# Patient Record
Sex: Male | Born: 1960
Health system: Southern US, Community
[De-identification: ages and names within clinical notes are randomized; demographics above are authoritative.]

## PROBLEM LIST (undated history)

## (undated) DIAGNOSIS — G5600 Carpal tunnel syndrome, unspecified upper limb: Secondary | ICD-10-CM

## (undated) DIAGNOSIS — H9190 Unspecified hearing loss, unspecified ear: Secondary | ICD-10-CM

## (undated) DIAGNOSIS — R0683 Snoring: Secondary | ICD-10-CM

## (undated) DIAGNOSIS — Z460 Encounter for fitting and adjustment of spectacles and contact lenses: Secondary | ICD-10-CM

## (undated) DIAGNOSIS — I10 Essential (primary) hypertension: Secondary | ICD-10-CM

## (undated) HISTORY — PX: WISDOM TOOTH EXTRACTION: SHX21

---

## 1996-10-06 HISTORY — PX: FINGER ARTHROPLASTY: SHX5017

## 1998-10-06 HISTORY — PX: LUMBAR LAMINECTOMY: SHX95

## 1998-10-06 HISTORY — PX: APPENDECTOMY: SHX54

## 2000-10-16 ENCOUNTER — Inpatient Hospital Stay (HOSPITAL_COMMUNITY): Admission: EM | Admit: 2000-10-16 | Discharge: 2000-10-17 | Payer: Self-pay

## 2001-02-17 ENCOUNTER — Encounter: Admission: RE | Admit: 2001-02-17 | Discharge: 2001-02-17 | Payer: Self-pay | Admitting: Neurosurgery

## 2001-02-17 ENCOUNTER — Encounter: Payer: Self-pay | Admitting: Neurosurgery

## 2001-02-22 ENCOUNTER — Encounter: Payer: Self-pay | Admitting: Neurosurgery

## 2001-02-22 ENCOUNTER — Ambulatory Visit (HOSPITAL_COMMUNITY): Admission: RE | Admit: 2001-02-22 | Discharge: 2001-02-23 | Payer: Self-pay | Admitting: Neurosurgery

## 2005-02-11 ENCOUNTER — Ambulatory Visit: Payer: Self-pay | Admitting: Internal Medicine

## 2006-04-22 ENCOUNTER — Ambulatory Visit: Payer: Self-pay | Admitting: Internal Medicine

## 2007-06-14 DIAGNOSIS — F411 Generalized anxiety disorder: Secondary | ICD-10-CM | POA: Insufficient documentation

## 2007-06-14 DIAGNOSIS — K219 Gastro-esophageal reflux disease without esophagitis: Secondary | ICD-10-CM | POA: Insufficient documentation

## 2011-03-13 ENCOUNTER — Ambulatory Visit
Admission: RE | Admit: 2011-03-13 | Discharge: 2011-03-13 | Disposition: A | Discharge status: Home / Self Care | Payer: BC Managed Care – PPO | Source: Ambulatory Visit | Attending: Family Medicine | Admitting: Family Medicine

## 2011-03-13 ENCOUNTER — Other Ambulatory Visit: Payer: Self-pay | Admitting: Family Medicine

## 2011-03-13 DIAGNOSIS — R0989 Other specified symptoms and signs involving the circulatory and respiratory systems: Secondary | ICD-10-CM

## 2011-03-13 DIAGNOSIS — R05 Cough: Secondary | ICD-10-CM

## 2011-03-13 DIAGNOSIS — R509 Fever, unspecified: Secondary | ICD-10-CM

## 2013-12-20 ENCOUNTER — Other Ambulatory Visit: Payer: Self-pay | Admitting: Orthopedic Surgery

## 2013-12-27 ENCOUNTER — Encounter (HOSPITAL_BASED_OUTPATIENT_CLINIC_OR_DEPARTMENT_OTHER): Payer: Self-pay | Admitting: *Deleted

## 2013-12-27 NOTE — Progress Notes (Signed)
No labs needed

## 2013-12-28 NOTE — H&P (Signed)
  [name] is an 53 y.o. male.   Chief Complaint: c/o chronic and progressive numbness and tingling of the left hand HPI: [name] returns for follow up evaluation of his bilateral carpal tunnel syndrome. He has had carpal tunnel symptoms for more than 10 years. He is accompanied by his wife during our consult. In 2011 we noted bilateral moderate carpal tunnel syndrome. He has lived with his splints and has postponed surgery until this time. He can no longer sleep comfortably at night. He has marked numbness any time he uses any vibrating tools at work. 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   Past Medical History  Diagnosis Date  . Contact lens/glasses fitting     wears contacts or glasses  . HOH (hard of hearing)     left ear  . Snores   . Carpal tunnel syndrome     Past Surgical History  Procedure Laterality Date  . Wisdom tooth extraction    . Lumbar laminectomy  2000  . Appendectomy  2000    lap append  . Finger arthroplasty  1998    cut tip lt index finger    History reviewed. No pertinent family history. Social History:  reports that he has never smoked. His smokeless tobacco use includes Chew. He reports that he does not drink alcohol or use illicit drugs.  Allergies: No Known Allergies  No prescriptions prior to admission    No results found for this or any previous visit (from the past 48 hour(s)).  No results found.   Pertinent items are noted in HPI.  Height 6' (1.829 m), weight 102.059 kg (225 lb).  General appearance: alert Head: Normocephalic, without obvious abnormality Neck: supple, symmetrical, trachea midline Resp: clear to auscultation bilaterally Cardio: regular rate and rhythm GI: normal findings: bowel sounds normal Extremities: . Inspection of his hands reveals diminished sweat patterns in the median distribution bilaterally. He has intact sensibility in the ulnar distribution and diminished sensibility in  the median distribution bilaterally. His radial sensibility is normal. He has full ROM of his wrist, fingers and thumbs. There is no sign of stenosing tenosynovitis. He has a positive wrist flexion test on the left at 10 seconds and right at 20 seconds.  Screening films of the hands demonstrate an osteophyte of his right distal scaphoid with a cyst in the distal pole of the scaphoid and minimal joint space narrowing at several IP joints.  X-rays of the left hand and wrist demonstrate a similar osteophyte at the distal pole of the left scaphoid without a cyst in the distal scaphoid. Pulses: 2+ and symmetric Skin: normal Neurologic: Grossly normal    Assessment/Plan Impression:  Left CTS  Plan: To the OR for left CTR.The procedure, risks,benefits and post-op course were discussed with the patient at length and they were in agreement with the plan.  DASNOIT,Hasnain Manheim J 12/28/2013, 4:25 PM  H&P documentation: 12/29/2013  -History and Physical Reviewed  -Patient has been re-examined  -No change in the plan of care  Wyn Forsterobert V Magdalynn Davilla Jr, MD

## 2013-12-29 ENCOUNTER — Encounter (HOSPITAL_BASED_OUTPATIENT_CLINIC_OR_DEPARTMENT_OTHER)
Admission: RE | Disposition: A | Discharge status: Home / Self Care | Payer: Self-pay | Source: Ambulatory Visit | Attending: Orthopedic Surgery

## 2013-12-29 ENCOUNTER — Ambulatory Visit (HOSPITAL_BASED_OUTPATIENT_CLINIC_OR_DEPARTMENT_OTHER): Payer: BC Managed Care – PPO | Admitting: Anesthesiology

## 2013-12-29 ENCOUNTER — Encounter (HOSPITAL_BASED_OUTPATIENT_CLINIC_OR_DEPARTMENT_OTHER): Payer: BC Managed Care – PPO | Admitting: Anesthesiology

## 2013-12-29 ENCOUNTER — Ambulatory Visit (HOSPITAL_BASED_OUTPATIENT_CLINIC_OR_DEPARTMENT_OTHER)
Admission: RE | Admit: 2013-12-29 | Discharge: 2013-12-29 | Disposition: A | Discharge status: Home / Self Care | Payer: BC Managed Care – PPO | Source: Ambulatory Visit | Attending: Orthopedic Surgery | Admitting: Orthopedic Surgery

## 2013-12-29 ENCOUNTER — Encounter (HOSPITAL_BASED_OUTPATIENT_CLINIC_OR_DEPARTMENT_OTHER): Payer: Self-pay

## 2013-12-29 DIAGNOSIS — G5600 Carpal tunnel syndrome, unspecified upper limb: Secondary | ICD-10-CM | POA: Insufficient documentation

## 2013-12-29 DIAGNOSIS — H9190 Unspecified hearing loss, unspecified ear: Secondary | ICD-10-CM | POA: Insufficient documentation

## 2013-12-29 DIAGNOSIS — K219 Gastro-esophageal reflux disease without esophagitis: Secondary | ICD-10-CM | POA: Insufficient documentation

## 2013-12-29 HISTORY — DX: Carpal tunnel syndrome, unspecified upper limb: G56.00

## 2013-12-29 HISTORY — DX: Snoring: R06.83

## 2013-12-29 HISTORY — PX: CARPAL TUNNEL RELEASE: SHX101

## 2013-12-29 HISTORY — DX: Unspecified hearing loss, unspecified ear: H91.90

## 2013-12-29 HISTORY — DX: Encounter for fitting and adjustment of spectacles and contact lenses: Z46.0

## 2013-12-29 LAB — POCT HEMOGLOBIN-HEMACUE: Hemoglobin: 16.8 g/dL (ref 13.0–17.0)

## 2013-12-29 SURGERY — CARPAL TUNNEL RELEASE
Anesthesia: General | Site: Wrist | Laterality: Left

## 2013-12-29 MED ORDER — LACTATED RINGERS IV SOLN
INTRAVENOUS | Status: DC
  Administered 2013-12-29: 08:00:00 via INTRAVENOUS

## 2013-12-29 MED ORDER — FENTANYL CITRATE 0.05 MG/ML IJ SOLN
INTRAMUSCULAR | Status: DC | PRN
  Administered 2013-12-29: 100 ug via INTRAVENOUS

## 2013-12-29 MED ORDER — FENTANYL CITRATE 0.05 MG/ML IJ SOLN
INTRAMUSCULAR | Status: AC
  Filled 2013-12-29: qty 4

## 2013-12-29 MED ORDER — MIDAZOLAM HCL 2 MG/2ML IJ SOLN
INTRAMUSCULAR | Status: AC
  Filled 2013-12-29: qty 2

## 2013-12-29 MED ORDER — METOCLOPRAMIDE HCL 5 MG/ML IJ SOLN: 10.0000 mg | Freq: Once | INTRAMUSCULAR | Status: DC | PRN

## 2013-12-29 MED ORDER — LIDOCAINE HCL 2 % IJ SOLN
INTRAMUSCULAR | Status: DC | PRN
  Administered 2013-12-29: 2 mL

## 2013-12-29 MED ORDER — ONDANSETRON HCL 4 MG/2ML IJ SOLN
INTRAMUSCULAR | Status: DC | PRN
  Administered 2013-12-29: 4 mg via INTRAVENOUS

## 2013-12-29 MED ORDER — LIDOCAINE HCL (CARDIAC) 20 MG/ML IV SOLN
INTRAVENOUS | Status: DC | PRN
  Administered 2013-12-29: 50 mg via INTRAVENOUS

## 2013-12-29 MED ORDER — FENTANYL CITRATE 0.05 MG/ML IJ SOLN: 25.0000 ug | INTRAMUSCULAR | Status: DC | PRN

## 2013-12-29 MED ORDER — OXYCODONE-ACETAMINOPHEN 5-325 MG PO TABS: ORAL_TABLET | ORAL | Status: DC

## 2013-12-29 MED ORDER — CEFAZOLIN SODIUM-DEXTROSE 2-3 GM-% IV SOLR
INTRAVENOUS | Status: AC
  Filled 2013-12-29: qty 50

## 2013-12-29 MED ORDER — OXYCODONE HCL 5 MG/5ML PO SOLN: 5.0000 mg | Freq: Once | ORAL | Status: DC | PRN

## 2013-12-29 MED ORDER — FENTANYL CITRATE 0.05 MG/ML IJ SOLN
50.0000 ug | INTRAMUSCULAR | Status: DC | PRN
Start: 2013-12-29 — End: 2013-12-29

## 2013-12-29 MED ORDER — PROPOFOL 10 MG/ML IV BOLUS
INTRAVENOUS | Status: DC | PRN
  Administered 2013-12-29: 200 mg via INTRAVENOUS

## 2013-12-29 MED ORDER — OXYCODONE HCL 5 MG PO TABS: 5.0000 mg | ORAL_TABLET | Freq: Once | ORAL | Status: DC | PRN

## 2013-12-29 MED ORDER — DEXAMETHASONE SODIUM PHOSPHATE 10 MG/ML IJ SOLN
INTRAMUSCULAR | Status: DC | PRN
  Administered 2013-12-29: 10 mg via INTRAVENOUS

## 2013-12-29 MED ORDER — CHLORHEXIDINE GLUCONATE 4 % EX LIQD: 60.0000 mL | Freq: Once | CUTANEOUS | Status: DC

## 2013-12-29 MED ORDER — MIDAZOLAM HCL 2 MG/2ML IJ SOLN: 1.0000 mg | INTRAMUSCULAR | Status: DC | PRN

## 2013-12-29 MED ORDER — CEFAZOLIN SODIUM-DEXTROSE 2-3 GM-% IV SOLR: 2.0000 g | INTRAVENOUS | Status: DC

## 2013-12-29 MED ORDER — MIDAZOLAM HCL 5 MG/5ML IJ SOLN
INTRAMUSCULAR | Status: DC | PRN
  Administered 2013-12-29: 2 mg via INTRAVENOUS

## 2013-12-29 SURGICAL SUPPLY — 43 items
BANDAGE ADH SHEER 1  50/CT (GAUZE/BANDAGES/DRESSINGS) IMPLANT
BANDAGE ELASTIC 3 VELCRO ST LF (GAUZE/BANDAGES/DRESSINGS) ×1 IMPLANT
BLADE SURG 15 STRL LF DISP TIS (BLADE) ×1 IMPLANT
BLADE SURG 15 STRL SS (BLADE) ×3
BNDG CMPR 9X4 STRL LF SNTH (GAUZE/BANDAGES/DRESSINGS) ×1
BNDG COHESIVE 3X5 TAN STRL LF (GAUZE/BANDAGES/DRESSINGS) ×2 IMPLANT
BNDG ESMARK 4X9 LF (GAUZE/BANDAGES/DRESSINGS) ×2 IMPLANT
BRUSH SCRUB EZ PLAIN DRY (MISCELLANEOUS) ×3 IMPLANT
CLOSURE WOUND 1/2 X4 (GAUZE/BANDAGES/DRESSINGS) ×1
CORDS BIPOLAR (ELECTRODE) ×2 IMPLANT
COVER MAYO STAND STRL (DRAPES) ×3 IMPLANT
COVER TABLE BACK 60X90 (DRAPES) ×3 IMPLANT
CUFF TOURNIQUET SINGLE 18IN (TOURNIQUET CUFF) ×2 IMPLANT
DECANTER SPIKE VIAL GLASS SM (MISCELLANEOUS) IMPLANT
DRAPE EXTREMITY T 121X128X90 (DRAPE) ×3 IMPLANT
DRAPE SURG 17X23 STRL (DRAPES) ×3 IMPLANT
GLOVE BIOGEL M STRL SZ7.5 (GLOVE) ×3 IMPLANT
GLOVE BIOGEL PI IND STRL 7.0 (GLOVE) IMPLANT
GLOVE BIOGEL PI INDICATOR 7.0 (GLOVE) ×2
GLOVE ECLIPSE 6.5 STRL STRAW (GLOVE) ×2 IMPLANT
GLOVE EXAM NITRILE EXT CUFF MD (GLOVE) ×2 IMPLANT
GLOVE ORTHO TXT STRL SZ7.5 (GLOVE) ×3 IMPLANT
GOWN STRL REUS W/ TWL LRG LVL3 (GOWN DISPOSABLE) ×1 IMPLANT
GOWN STRL REUS W/ TWL XL LVL3 (GOWN DISPOSABLE) ×2 IMPLANT
GOWN STRL REUS W/TWL LRG LVL3 (GOWN DISPOSABLE) ×3
GOWN STRL REUS W/TWL XL LVL3 (GOWN DISPOSABLE) ×3
NEEDLE 27GAX1X1/2 (NEEDLE) ×2 IMPLANT
PACK BASIN DAY SURGERY FS (CUSTOM PROCEDURE TRAY) ×3 IMPLANT
PAD CAST 3X4 CTTN HI CHSV (CAST SUPPLIES) ×1 IMPLANT
PADDING CAST ABS 4INX4YD NS (CAST SUPPLIES)
PADDING CAST ABS COTTON 4X4 ST (CAST SUPPLIES) ×1 IMPLANT
PADDING CAST COTTON 3X4 STRL (CAST SUPPLIES) ×3
SPLINT PLASTER CAST XFAST 3X15 (CAST SUPPLIES) ×5 IMPLANT
SPLINT PLASTER XTRA FASTSET 3X (CAST SUPPLIES) ×10
SPONGE GAUZE 4X4 12PLY (GAUZE/BANDAGES/DRESSINGS) ×3 IMPLANT
STOCKINETTE 4X48 STRL (DRAPES) ×3 IMPLANT
STRIP CLOSURE SKIN 1/2X4 (GAUZE/BANDAGES/DRESSINGS) ×2 IMPLANT
SUT PROLENE 3 0 PS 2 (SUTURE) ×3 IMPLANT
SYR 3ML 23GX1 SAFETY (SYRINGE) IMPLANT
SYR CONTROL 10ML LL (SYRINGE) ×2 IMPLANT
TOWEL OR 17X24 6PK STRL BLUE (TOWEL DISPOSABLE) ×3 IMPLANT
TRAY DSU PREP LF (CUSTOM PROCEDURE TRAY) ×3 IMPLANT
UNDERPAD 30X30 INCONTINENT (UNDERPADS AND DIAPERS) ×3 IMPLANT

## 2013-12-29 NOTE — Op Note (Signed)
[id number] 

## 2013-12-29 NOTE — Op Note (Signed)
[name], YIELDING NO.:  [id number]  MEDICAL RECORD NO.:  [id number]  LOCATION:                                 FACILITY:  PHYSICIAN:  Craig Watson. Craig Watson, M.D.      DATE OF BIRTH:  DATE OF PROCEDURE:  12/29/2013 DATE OF DISCHARGE:                              OPERATIVE REPORT   PREOPERATIVE DIAGNOSIS:  Chronic left median entrapment neuropathy at carpal tunnel.  POSTOPERATIVE DIAGNOSIS:  Chronic left median entrapment neuropathy at carpal tunnel.  OPERATION:  Release of left transverse carpal ligament.  OPERATING SURGEON:  Craig Watson. Craig Vanamburg, MD  ASSISTANT:  Surgical technician.  ANESTHESIA:  General by LMA.  SUPERVISING ANESTHESIOLOGIST:  Craig Watson. Craig Mink, MD.  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  His primary care physician Dr. Manus Watson noted numbness and suggested a upper extremity orthopedic evaluation.  Clinical examination revealed signs of probable carpal tunnel syndrome and electrodiagnostic studies confirmed median entrapment neuropathy at wrist level.  After detailed informed consent, Craig Watson was brought to the operating room at this time for release of his left transverse carpal ligament.  Preoperatively, Craig Watson was interviewed by Dr. Gelene Watson in the holding area. General anesthesia by LMA technique versus MAC was discussed.  Craig Watson ultimately selected general anesthesia by LMA technique.  After detailed informed consent in the office and in the holding area questions were invited and answered.  His left hand was marked as the proper surgical site per protocol with marking pen.  Craig Watson is now brought to the operating room for release of the left transverse carpal ligament.  PROCEDURE:  Craig Watson was brought to room 1 of the [facility] and placed in supine position on the operating table.  Following the induction of general anesthesia by LMA  technique, the left hand and arm were prepped with Betadine soap and solution and sterilely draped.  A pneumatic tourniquet was applied to the proximal left brachium.  Following routine Betadine scrub and paint, sterile stockinette and impervious arthroscopy drapes were applied.  Following exsanguination of the left arm with Esmarch bandage, arterial tourniquet was inflated to 220 mmHg.  A routine surgical time-out was accomplished.  The procedure commenced with a short 2-cm incision was fashioned in the line of the ring finger in the proximal palm.  The subcutaneous tissues were carefully divided and bleeding points were electrocauterized with bipolar forceps.  The palmar fascia was split longitudinally in the line of its fibers. The superficial palmar arch, and the distal margin of the transverse carpal ligament were identified followed by sound in the canal with a Insurance risk surveyor.  The distal aspect of the transverse carpal ligament was released with tenotomy scissors working proximally.  We then passed a [place] 4 elevator into the carpal canal and once a pathway was cleared superficial and deep to the ligament, the ligament was released with scissors along its ulnar border extending into the distal forearm.  This widely opened the carpal canal.  The ulnar bursa was fibrotic and quite thick.  There were no masses or other predicaments noted.  The wound was then repaired with  intradermal 3-0 Prolene with Steri- Strips.  A 2% lidocaine was infiltrated along the wound margins for postoperative comfort. Compressive dressing was applied with a volar plaster splint maintaining the wrist in 15 degrees of dorsiflexion.  For aftercare, Craig Watson was provided prescription for Percocet 5 mg 1 p.o. q.4-6 hours p.r.n. pain, 20 tabs without refill.  Craig Watson will likely use ibuprofen.     Craig Watson, M.D.     RVS/MEDQ  D:  12/29/2013  T:  12/29/2013  Job:  [id number]  cc:    Craig Watson, M.D.

## 2013-12-29 NOTE — Transfer of Care (Signed)
Immediate Anesthesia Transfer of Care Note  Patient: Craig Watson  Procedure(s) Performed: Procedure(s): LEFT CARPAL TUNNEL RELEASE (Left)  Patient Location: PACU  Anesthesia Type:General  Level of Consciousness: sedated  Airway & Oxygen Therapy: Patient Spontanous Breathing and Patient connected to face mask oxygen  Post-op Assessment: Report given to PACU RN and Post -op Vital signs reviewed and stable  Post vital signs: Reviewed and stable  Complications: No apparent anesthesia complications

## 2013-12-29 NOTE — Anesthesia Procedure Notes (Signed)
Procedure Name: LMA Insertion Performed by: Berthel Bagnall, [employer] Pre-anesthesia Checklist: Patient identified, Emergency Drugs available, Suction available and Patient being monitored Patient Re-evaluated:Patient Re-evaluated prior to inductionOxygen Delivery Method: Circle System Utilized Preoxygenation: Pre-oxygenation with 100% oxygen Intubation Type: IV induction Ventilation: Mask ventilation without difficulty LMA: LMA inserted LMA Size: 5.0 Number of attempts: 1 Airway Equipment and Method: bite block Placement Confirmation: positive ETCO2 Tube secured with: Tape Dental Injury: Teeth and Oropharynx as per pre-operative assessment      

## 2013-12-29 NOTE — Discharge Instructions (Addendum)

## 2013-12-29 NOTE — Anesthesia Preprocedure Evaluation (Signed)
Anesthesia Evaluation  Patient identified by MRN, date of birth, ID band Patient awake    Reviewed: Allergy & Precautions, H&P , NPO status , Patient's Chart, lab work & pertinent test results, reviewed documented beta blocker date and time   Airway Mallampati: II TM Distance: >3 FB Neck ROM: full    Dental   Pulmonary neg pulmonary ROS,  breath sounds clear to auscultation        Cardiovascular negative cardio ROS  Rhythm:regular     Neuro/Psych  Neuromuscular disease negative psych ROS   GI/Hepatic Neg liver ROS, GERD-  Medicated and Controlled,  Endo/Other  negative endocrine ROS  Renal/GU negative Renal ROS  negative genitourinary   Musculoskeletal   Abdominal   Peds  Hematology negative hematology ROS (+)   Anesthesia Other Findings See surgeon's H&P   Reproductive/Obstetrics negative OB ROS                           Anesthesia Physical Anesthesia Plan  ASA: I  Anesthesia Plan: General   Post-op Pain Management:    Induction: Intravenous  Airway Management Planned: LMA  Additional Equipment:   Intra-op Plan:   Post-operative Plan:   Informed Consent: I have reviewed the patients History and Physical, chart, labs and discussed the procedure including the risks, benefits and alternatives for the proposed anesthesia with the patient or authorized representative who has indicated his/her understanding and acceptance.   Dental Advisory Given  Plan Discussed with: CRNA and Surgeon  Anesthesia Plan Comments:         Anesthesia Quick Evaluation

## 2013-12-29 NOTE — Brief Op Note (Signed)
12/29/2013  8:56 AM  PATIENT:  Craig Watson  53 y.o. male  PRE-OPERATIVE DIAGNOSIS:  carpal tunnel syndrome left  POST-OPERATIVE DIAGNOSIS:  carpal tunnel syndrome left  PROCEDURE:  Procedure(s): LEFT CARPAL TUNNEL RELEASE (Left)  SURGEON:  Surgeon(s) and Role:    * Wyn Forsterobert V Relda Agosto Jr., MD - Primary  PHYSICIAN ASSISTANT:   ASSISTANTS: surgical tech   ANESTHESIA:   general  EBL:     BLOOD ADMINISTERED:none  DRAINS: none   LOCAL MEDICATIONS USED:  XYLOCAINE   SPECIMEN:  No Specimen  DISPOSITION OF SPECIMEN:  N/A  COUNTS:  YES  TOURNIQUET:   Total Tourniquet Time Documented: Upper Arm (Left) - 12 minutes Total: Upper Arm (Left) - 12 minutes   DICTATION: .Other Dictation: Dictation Number [id number]  PLAN OF CARE: Discharge to home after PACU  PATIENT DISPOSITION:  PACU - hemodynamically stable.   Delay start of Pharmacological VTE agent (>24hrs) due to surgical blood loss or risk of bleeding: not applicable

## 2013-12-29 NOTE — Anesthesia Postprocedure Evaluation (Signed)
Anesthesia Post Note  Patient: Craig NeedlesDarrell S Watson  Procedure(s) Performed: Procedure(s) (LRB): LEFT CARPAL TUNNEL RELEASE (Left)  Anesthesia type: General  Patient location: PACU  Post pain: Pain level controlled  Post assessment: Patient's Cardiovascular Status Stable  Last Vitals:  Filed Vitals:   12/29/13 0930  BP: 114/83  Pulse: 66  Temp:   Resp: 13    Post vital signs: Reviewed and stable  Level of consciousness: alert  Complications: No apparent anesthesia complications

## 2013-12-30 ENCOUNTER — Encounter (HOSPITAL_BASED_OUTPATIENT_CLINIC_OR_DEPARTMENT_OTHER): Payer: Self-pay | Admitting: Orthopedic Surgery

## 2014-01-13 ENCOUNTER — Emergency Department (HOSPITAL_COMMUNITY): Payer: BC Managed Care – PPO

## 2014-01-13 ENCOUNTER — Encounter (HOSPITAL_COMMUNITY): Payer: Self-pay | Admitting: Emergency Medicine

## 2014-01-13 ENCOUNTER — Emergency Department (HOSPITAL_COMMUNITY)
Admission: EM | Admit: 2014-01-13 | Discharge: 2014-01-13 | Disposition: A | Discharge status: Home / Self Care | Payer: BC Managed Care – PPO | Attending: Emergency Medicine | Admitting: Emergency Medicine

## 2014-01-13 DIAGNOSIS — R110 Nausea: Secondary | ICD-10-CM | POA: Insufficient documentation

## 2014-01-13 DIAGNOSIS — Z9089 Acquired absence of other organs: Secondary | ICD-10-CM | POA: Insufficient documentation

## 2014-01-13 DIAGNOSIS — N200 Calculus of kidney: Secondary | ICD-10-CM | POA: Insufficient documentation

## 2014-01-13 DIAGNOSIS — Z8669 Personal history of other diseases of the nervous system and sense organs: Secondary | ICD-10-CM | POA: Insufficient documentation

## 2014-01-13 DIAGNOSIS — R61 Generalized hyperhidrosis: Secondary | ICD-10-CM | POA: Insufficient documentation

## 2014-01-13 DIAGNOSIS — Z8739 Personal history of other diseases of the musculoskeletal system and connective tissue: Secondary | ICD-10-CM | POA: Insufficient documentation

## 2014-01-13 DIAGNOSIS — Z79899 Other long term (current) drug therapy: Secondary | ICD-10-CM | POA: Insufficient documentation

## 2014-01-13 LAB — CBC WITH DIFFERENTIAL/PLATELET
Basophils Absolute: 0 10*3/uL (ref 0.0–0.1)
Basophils Relative: 0 % (ref 0–1)
EOS ABS: 0.1 10*3/uL (ref 0.0–0.7)
EOS PCT: 1 % (ref 0–5)
HEMATOCRIT: 44.3 % (ref 39.0–52.0)
Hemoglobin: 15.7 g/dL (ref 13.0–17.0)
LYMPHS ABS: 1.5 10*3/uL (ref 0.7–4.0)
Lymphocytes Relative: 19 % (ref 12–46)
MCH: 31 pg (ref 26.0–34.0)
MCHC: 35.4 g/dL (ref 30.0–36.0)
MCV: 87.4 fL (ref 78.0–100.0)
MONO ABS: 0.5 10*3/uL (ref 0.1–1.0)
Monocytes Relative: 7 % (ref 3–12)
Neutro Abs: 5.4 10*3/uL (ref 1.7–7.7)
Neutrophils Relative %: 73 % (ref 43–77)
Platelets: 238 10*3/uL (ref 150–400)
RBC: 5.07 MIL/uL (ref 4.22–5.81)
RDW: 12.7 % (ref 11.5–15.5)
WBC: 7.5 10*3/uL (ref 4.0–10.5)

## 2014-01-13 LAB — LIPASE, BLOOD: Lipase: 36 U/L (ref 11–59)

## 2014-01-13 LAB — URINE MICROSCOPIC-ADD ON

## 2014-01-13 LAB — COMPREHENSIVE METABOLIC PANEL
ALT: 41 U/L (ref 0–53)
AST: 34 U/L (ref 0–37)
Albumin: 4.1 g/dL (ref 3.5–5.2)
Alkaline Phosphatase: 63 U/L (ref 39–117)
BUN: 12 mg/dL (ref 6–23)
CALCIUM: 9.3 mg/dL (ref 8.4–10.5)
CO2: 20 meq/L (ref 19–32)
CREATININE: 0.92 mg/dL (ref 0.50–1.35)
Chloride: 103 mEq/L (ref 96–112)
GFR calc Af Amer: >90 mL/min (ref >90)
GLUCOSE: 93 mg/dL (ref 70–99)
Potassium: 4.3 mEq/L (ref 3.7–5.3)
Sodium: 139 mEq/L (ref 137–147)
Total Bilirubin: 0.7 mg/dL (ref 0.3–1.2)
Total Protein: 7.4 g/dL (ref 6.0–8.3)

## 2014-01-13 LAB — URINALYSIS, ROUTINE W REFLEX MICROSCOPIC
BILIRUBIN URINE: NEGATIVE
Glucose, UA: NEGATIVE mg/dL
Ketones, ur: NEGATIVE mg/dL
Leukocytes, UA: NEGATIVE
Nitrite: NEGATIVE
Protein, ur: NEGATIVE mg/dL
Specific Gravity, Urine: 1.015 (ref 1.005–1.030)
UROBILINOGEN UA: 0.2 mg/dL (ref 0.0–1.0)
pH: 5 (ref 5.0–8.0)

## 2014-01-13 MED ORDER — ONDANSETRON HCL 4 MG PO TABS: 4.0000 mg | ORAL_TABLET | Freq: Three times a day (TID) | ORAL | Status: AC | PRN

## 2014-01-13 MED ORDER — HYDROCODONE-ACETAMINOPHEN 5-325 MG PO TABS
1.0000 | ORAL_TABLET | ORAL | Status: DC | PRN
Start: 2014-01-13 — End: 2020-02-20

## 2014-01-13 NOTE — ED Notes (Signed)
Pt states that he has been experiencing intermittent RLQ pain for about 6 months. States that he normally thinks that it is a pulled muscle or constipation. States that he experienced sharp pain this morning for about 2 hours. States that he did do some heavy lifting yesterday. Last BM today.

## 2014-01-13 NOTE — Discharge Instructions (Signed)
Your CT scan shows a 1.5 centimeter stone in the R kidney. Most likely you recently passed another kidney stone. Your lab work showed blood in your urine was otherwise unremarkable. The stone in your kidney presently would not have caused your pain this morning, but it does have potential to move out of the kidney and cause pain. You are being discharged with pain medication and antinausea medicine. Follow up with your doctor.  SEEK IMMEDIATE MEDICAL ATTENTION IF: The pain does not go away or becomes severe.  A temperature above 101 develops.  Repeated vomiting occurs (multiple episodes).  The pain becomes localized to portions of the abdomen. The right side could possibly be appendicitis. In an adult, the left lower portion of the abdomen could be colitis or diverticulitis.  Blood is being passed in stools or vomit (bright red or black tarry stools).  Return also if you develop chest pain, difficulty breathing, dizziness or fainting, or become confused, poorly responsive, or inconsolable (young children).   Diet for Kidney Stones Kidney stones are small, hard masses that form inside your kidneys. They are made up of salts and minerals and often form when high levels build up in the urine. The minerals can then start to build up, crystalize, and stick together to form stones. There are several different types of kidney stones. The following types of stones may be influenced by dietary factors:   Calcium Oxalate Stones. An oxalate is a salt found in certain foods. Within the body, calcium can combine with oxalates to form calcium oxalate stones, which can be excreted in the urine in high amounts. This is the most common type of kidney stone.  Calcium Phosphate Stones. These stones may occur when the pH of the urine becomes too high, or less acidic, from too much calcium being excreted in the urine. The pH is a measure of how acidic or basic a substance is.  Uric Acid Stones. This type of stone  occurs when the pH of the urine becomes too low, or very acidic, because substances called purines build up in the urine. Purines are found in animal proteins. When the urine is highly concentrated with acid, uric acid kidney stones can form.  Other risk factors for kidney stones include genetics, environment, and being overweight. Your caregiver may ask you to follow specific diet guidelines based on the type of stone you have to lessen the chances of your body making more kidney stones.  GENERAL GUIDELINES FOR ALL TYPES OF STONES  Drink plenty of fluid. Drink 12 16 cups of fluid a day, drinking mainly water.This is the most important thing you can do to prevent the formation of future kidney stones.  Maintain a healthy weight. Your caregiver or dietitian can help you determine what a healthy weight is for you. If you are overweight, weight loss may help prevent the formation of future kidney stones.  Eat a diet adequate in animal protein. Too much animal protein can contribute to the formation of stones. Your dietitian can help you determine how much protein you should be eating. Avoid low carbohydrate, high protein diets.  Follow a balanced eating approach. The DASH diet, which stands for "Dietary Approaches to Stop Hypertension," is an effective meal plan for reducing stone formation. This diet is high in fruits, vegetables, dairy, and whole grains and low in animal protein. Ask your caregiver or dietitian for information about the DASH diet. ADDITIONAL DIET GUIDELINES FOR CALCIUM STONES Avoid foods high in salt. This includes  table salt, salt seasonings, MSG, soy sauce, cured and processed meats, salted crackers and snack foods, fast food, and canned soups and foods. Ask your caregiver or dietitian for information about reducing sodium in your diet or following the low sodium diet.  Ensure adequate calcium intake. Use the following table for calcium guidelines:  Men 51 years old and younger   1000 mg/day.  Men 51 years old and older  1500 mg/day.  Women 7825 51 years old  1000 mg/day.  Women 50 years and older  1500 mg/day. Your dietitian can help you determine if you are getting enough calcium in your diet. Foods that are high in calcium include dairy products, broccoli, cheese, yogurt, and pudding. If you need to take a calcium supplement, take it only in the form of calcium citrate.  Avoid foods high in oxalate. Be sure that any supplements you take do not contain more than 500 mg of vitamin C. Vitamin C is converted into oxalate in the body. You do not need to avoid fruits and vegetables high in vitamin C.   Grains: High-fiber or bran cereal, whole-wheat bread, grits, barley, buckwheat, amaranth, pretzels, and fruitcake.  Vegetables: Dried beans, wax beans, dark leafy greens, eggplant, leeks, okra, parsley, rutabaga, tomato paste, watercress, zucchini, and escarole.  Fruit: Dried apricots, red currants, figs, kiwi, and rhubarb.  Meat and Meat Substitutes: Soybeans and foods made from soy (soyburger, miso), dried beans, peanut butter.  Milk: Chocolate milk mixes and soymilk.  Fats and Oils: Nuts (peanuts, almonds, pecans, cashews, hazelnuts) and nut butters, sesame seeds, and tDahini paste.  Condiments/Miscellaneous: Chocolate, carob, marmalade, poppy seeds, instant iced tea, and juice from high-oxalate fruits.  Document Released: 01/17/2011 Document Revised: 03/23/2012 Document Reviewed: 03/08/2012 [facility] Patient Information 2014 [employer], [place].  Ureteral Colic (Kidney Stones) Ureteral colic is the result of a condition when kidney stones form inside the kidney. Once kidney stones are formed they may move into the tube that connects the kidney with the bladder (ureter). If this occurs, this condition may cause pain (colic) in the ureter.  CAUSES  Pain is caused by stone movement in the ureter and the obstruction caused by the stone. SYMPTOMS  The pain comes and goes  as the ureter contracts around the stone. The pain is usually intense, sharp, and stabbing in character. The location of the pain may move as the stone moves through the ureter. When the stone is near the kidney the pain is usually located in the back and radiates to the belly (abdomen). When the stone is ready to pass into the bladder the pain is often located in the lower abdomen on the side the stone is located. At this location, the symptoms may mimic those of a urinary tract infection with urinary frequency. Once the stone is located here it often passes into the bladder and the pain disappears completely. TREATMENT   Your caregiver will provide you with medicine for pain relief.  You may require specialized follow-up X-rays.  The absence of pain does not always mean that the stone has passed. It may have just stopped moving. If the urine remains completely obstructed, it can cause loss of kidney function or even complete destruction of the involved kidney. It is your responsibility and in your interest that X-rays and follow-ups as suggested by your caregiver are completed. Relief of pain without passage of the stone can be associated with severe damage to the kidney, including loss of kidney function on that side.  If your  stone does not pass on its own, additional measures may be taken by your caregiver to ensure its removal. HOME CARE INSTRUCTIONS   Increase your fluid intake. Water is the preferred fluid since juices containing vitamin C may acidify the urine making it less likely for certain stones (uric acid stones) to pass.  Strain all urine. A strainer will be provided. Keep all particulate matter or stones for your caregiver to inspect.  Take your pain medicine as directed.  Make a follow-up appointment with your caregiver as directed.  Remember that the goal is passage of your stone. The absence of pain does not mean the stone is gone. Follow your caregiver's instructions.  Only  take over-the-counter or prescription medicines for pain, discomfort, or fever as directed by your caregiver. SEEK MEDICAL CARE IF:   Pain cannot be controlled with the prescribed medicine.  You have a fever.  Pain continues for longer than your caregiver advises it should.  There is a change in the pain, and you develop chest discomfort or constant abdominal pain.  You feel faint or pass out. MAKE SURE YOU:   Understand these instructions.  Will watch your condition.  Will get help right away if you are not doing well or get worse. Document Released: 07/02/2005 Document Revised: 01/17/2013 Document Reviewed: 03/19/2011 [facility] Patient Information 2014 [place], [place].

## 2014-01-13 NOTE — ED Notes (Signed)
Pt states pain starting tonight around 0100 that has been intermitten for a couple month now.  Pt states no changes in urinary or BM habits.

## 2014-01-13 NOTE — ED Provider Notes (Signed)
CSN: [id number]     Arrival date & time 01/13/14  0548 History   First MD Initiated Contact with Patient 01/13/14 [id number]     Chief Complaint  Patient presents with  . Abdominal Pain     (Consider location/radiation/quality/duration/timing/severity/associated sxs/prior Treatment) HPI Craig Watson Is a 53 year old male who presents the emergency department with chief complaint of right lower quadrant abdominal pain.  Patient has a past medical history of lumbar laminectomy, laparoscopic appendectomy, recent carpal tunnel release syndrome.  The patient states that he awoke from sleep this morning at approximately 1 AM with severe right lower quadrant abdominal pain.  Patient states that he was unable to find a position of comfort and was pacing the halls, diaphoretic, nauseous.  He describes it as severe, sharp, and the worst pain he ever had.  Patient states that he did notice some burning with urination today.  Patient was unsure if he had some constipation and took 2 capsules of MiraLax along with 2 cups of coffee without relief of his symptoms.  The patient states that around 5 AM his symptoms began to alleviate however his wife made him come to the hospital today for evaluation.  He does have a past family history of father with one kidney stone.  Patient is a Product/process development scientist and also thought he might have an incarcerated hernia.  The patient denies any constipation, diarrhea, flatulence, vomiting, fever.  Patient denies any chest pain, shortness of breath.  He denies any myalgias, arthralgias, contacts with similar symptoms.  Patient denies flank pain.  He is currently pain-free and has urinated twice since being in the emergency department.  He denies any dysuria, hematuria at this time.   Past Medical History  Diagnosis Date  . Contact lens/glasses fitting     wears contacts or glasses  . HOH (hard of hearing)     left ear  . Snores   . Carpal tunnel syndrome    Past Surgical  History  Procedure Laterality Date  . Wisdom tooth extraction    . Lumbar laminectomy  2000  . Appendectomy  2000    lap append  . Finger arthroplasty  1998    cut tip lt index finger  . Carpal tunnel release Left 12/29/2013    Procedure: LEFT CARPAL TUNNEL RELEASE;  Surgeon: Wyn Forster., MD;  Location: [facility];  Service: Orthopedics;  Laterality: Left;   No family history on file. History  Substance Use Topics  . Smoking status: Never Smoker   . Smokeless tobacco: Current User    Types: Chew  . Alcohol Use: No    Review of Systems  Ten systems reviewed and are negative for acute change, except as noted in the HPI.    Allergies  Review of patient's allergies indicates no known allergies.  Home Medications   Current Outpatient Rx  Name  Route  Sig  Dispense  Refill  . omega-3 acid ethyl esters (LOVAZA) 1 G capsule   Oral   Take 1 g by mouth daily.         . polyethylene glycol (MIRALAX / GLYCOLAX) packet   Oral   Take 17 g by mouth daily as needed for mild constipation.          BP 155/103  Pulse 79  Temp(Src) 97.6 F (36.4 C) (Oral)  Resp 20  SpO2 100% Physical Exam Physical Exam  Nursing note and vitals reviewed. Constitutional: She is oriented to person, place, and  time. She appears well-developed and well-nourished. No distress.  HENT:  Head: Normocephalic and atraumatic.  Eyes: Conjunctivae normal and EOM are normal. Pupils are equal, round, and reactive to light. No scleral icterus.  Neck: Normal range of motion.  Cardiovascular: Normal rate, regular rhythm and normal heart sounds.  Exam reveals no gallop and no friction rub.   No murmur heard. Pulmonary/Chest: Effort normal and breath sounds normal. No respiratory distress.  Abdominal: Soft. Bowel sounds are normal. She exhibits no distension and no mass. There is no tenderness. There is no guarding.  no CVA tenderness . NO palpable Hernias. Neurological: She is alert and  oriented to person, place, and time.  Skin: Skin is warm and dry. She is not diaphoretic.    ED Course  Procedures (including critical care time) Labs Review Labs Reviewed  URINALYSIS, ROUTINE W REFLEX MICROSCOPIC - Abnormal; Notable for the following:    Hgb urine dipstick LARGE (*)    All other components within normal limits  URINE MICROSCOPIC-ADD ON - Abnormal; Notable for the following:    Casts HYALINE CASTS (*)    All other components within normal limits  CBC WITH DIFFERENTIAL  COMPREHENSIVE METABOLIC PANEL  LIPASE, BLOOD   Imaging Review No results found.   EKG Interpretation None      MDM   Final diagnoses:  Right nephrolithiasis    7:23 AM BP 155/103  Pulse 79  Temp(Src) 97.6 F (36.4 C) (Oral)  Resp 20  SpO2 100% Patient hypertensive. No abnormalities on blood work.  Casts and large hgb on UA. Suspect urolithiasis. CT abdomen pending.  Filed Vitals:   01/13/14 0554 01/13/14 0712 01/13/14 0730 01/13/14 0825  BP: 155/103 139/89 128/88 120/89  Pulse: 79 71 59 68  Temp: 97.6 F (36.4 C)     TempSrc: Oral     Resp: 20   14  SpO2: 100% 96% 96% 96%    Ct Negative except for nstone visualized in the R kidney. lLabs are reviewed and unremarkable other thatn hematuria and casts. Patient has had no repeat episodes of pain since urinating here in the ED.  Previous appendectomy. I feel again due to the patient's history and presentation this is most likely due to renal colic from stone tha  Was passed. Patient has had no vomiting, nausea or pain. Repeat exam shows no focal abdominal tenderness. He is hemodynamically stable. Larey SeatFell the patient is safe for discharge at this time and may follow up with his pcp.   Arthor CaptainAbigail Alieu Finnigan, PA-C 01/16/14 1214

## 2014-01-19 NOTE — ED Provider Notes (Signed)
Medical screening examination/treatment/procedure(s) were performed by non-physician practitioner and as supervising physician I was immediately available for consultation/collaboration.   Rayne Loiseau, MD 01/19/14 1326 

## 2020-02-20 ENCOUNTER — Encounter (HOSPITAL_BASED_OUTPATIENT_CLINIC_OR_DEPARTMENT_OTHER): Payer: Self-pay | Admitting: *Deleted

## 2020-02-20 ENCOUNTER — Emergency Department (HOSPITAL_BASED_OUTPATIENT_CLINIC_OR_DEPARTMENT_OTHER)
Admission: EM | Admit: 2020-02-20 | Discharge: 2020-02-20 | Disposition: A | Discharge status: Home / Self Care | Payer: Self-pay | Attending: Emergency Medicine | Admitting: Emergency Medicine

## 2020-02-20 ENCOUNTER — Other Ambulatory Visit: Payer: Self-pay

## 2020-02-20 ENCOUNTER — Emergency Department (HOSPITAL_BASED_OUTPATIENT_CLINIC_OR_DEPARTMENT_OTHER): Payer: Self-pay

## 2020-02-20 DIAGNOSIS — N201 Calculus of ureter: Secondary | ICD-10-CM | POA: Insufficient documentation

## 2020-02-20 DIAGNOSIS — N23 Unspecified renal colic: Secondary | ICD-10-CM

## 2020-02-20 HISTORY — DX: Essential (primary) hypertension: I10

## 2020-02-20 LAB — URINALYSIS, ROUTINE W REFLEX MICROSCOPIC
Bilirubin Urine: NEGATIVE
Glucose, UA: NEGATIVE mg/dL
Ketones, ur: NEGATIVE mg/dL
Leukocytes,Ua: NEGATIVE
Nitrite: NEGATIVE
Protein, ur: 100 mg/dL — AB
Specific Gravity, Urine: >1.03 — ABNORMAL HIGH (ref 1.005–1.030)
pH: 5 (ref 5.0–8.0)

## 2020-02-20 LAB — CBC WITH DIFFERENTIAL/PLATELET
Abs Immature Granulocytes: 0.03 10*3/uL (ref 0.00–0.07)
Basophils Absolute: 0 10*3/uL (ref 0.0–0.1)
Basophils Relative: 0 %
Eosinophils Absolute: 0 10*3/uL (ref 0.0–0.5)
Eosinophils Relative: 0 %
HCT: 48.3 % (ref 39.0–52.0)
Hemoglobin: 16.7 g/dL (ref 13.0–17.0)
Immature Granulocytes: 0 %
Lymphocytes Relative: 10 %
Lymphs Abs: 1.2 10*3/uL (ref 0.7–4.0)
MCH: 30.6 pg (ref 26.0–34.0)
MCHC: 34.6 g/dL (ref 30.0–36.0)
MCV: 88.6 fL (ref 80.0–100.0)
Monocytes Absolute: 0.6 10*3/uL (ref 0.1–1.0)
Monocytes Relative: 5 %
Neutro Abs: 9.5 10*3/uL — ABNORMAL HIGH (ref 1.7–7.7)
Neutrophils Relative %: 85 %
Platelets: 261 10*3/uL (ref 150–400)
RBC: 5.45 MIL/uL (ref 4.22–5.81)
RDW: 13 % (ref 11.5–15.5)
WBC: 11.4 10*3/uL — ABNORMAL HIGH (ref 4.0–10.5)
nRBC: 0 % (ref 0.0–0.2)

## 2020-02-20 LAB — BASIC METABOLIC PANEL
Anion gap: 12 (ref 5–15)
BUN: 11 mg/dL (ref 6–20)
CO2: 24 mmol/L (ref 22–32)
Calcium: 9.3 mg/dL (ref 8.9–10.3)
Chloride: 102 mmol/L (ref 98–111)
Creatinine, Ser: 1.07 mg/dL (ref 0.61–1.24)
GFR calc Af Amer: >60 mL/min (ref >60)
GFR calc non Af Amer: >60 mL/min (ref >60)
Glucose, Bld: 113 mg/dL — ABNORMAL HIGH (ref 70–99)
Potassium: 3.8 mmol/L (ref 3.5–5.1)
Sodium: 138 mmol/L (ref 135–145)

## 2020-02-20 LAB — URINALYSIS, MICROSCOPIC (REFLEX): RBC / HPF: >50 RBC/hpf (ref 0–5)

## 2020-02-20 MED ORDER — SODIUM CHLORIDE 0.9 % IV BOLUS
1000.0000 mL | Freq: Once | INTRAVENOUS | Status: AC
  Administered 2020-02-20: 1000 mL via INTRAVENOUS

## 2020-02-20 MED ORDER — KETOROLAC TROMETHAMINE 15 MG/ML IJ SOLN
15.0000 mg | Freq: Once | INTRAMUSCULAR | Status: AC
  Administered 2020-02-20: 15 mg via INTRAVENOUS
  Filled 2020-02-20: qty 1

## 2020-02-20 MED ORDER — ONDANSETRON 4 MG PO TBDP
4.0000 mg | ORAL_TABLET | Freq: Three times a day (TID) | ORAL | 0 refills | Status: AC | PRN
Start: 2020-02-20 — End: ?

## 2020-02-20 MED ORDER — OXYCODONE HCL 5 MG PO TABS
5.0000 mg | ORAL_TABLET | Freq: Once | ORAL | Status: AC
  Administered 2020-02-20: 5 mg via ORAL
  Filled 2020-02-20: qty 1

## 2020-02-20 MED ORDER — TAMSULOSIN HCL 0.4 MG PO CAPS: 0.4000 mg | ORAL_CAPSULE | Freq: Every day | ORAL | 0 refills | Status: AC

## 2020-02-20 MED ORDER — OXYCODONE HCL 5 MG PO CAPS: 5.0000 mg | ORAL_CAPSULE | Freq: Four times a day (QID) | ORAL | 0 refills | Status: AC | PRN

## 2020-02-20 MED ORDER — ONDANSETRON HCL 4 MG/2ML IJ SOLN
4.0000 mg | Freq: Once | INTRAMUSCULAR | Status: AC
  Administered 2020-02-20: 4 mg via INTRAVENOUS
  Filled 2020-02-20: qty 2

## 2020-02-20 MED ORDER — IBUPROFEN 800 MG PO TABS: 800.0000 mg | ORAL_TABLET | Freq: Three times a day (TID) | ORAL | 0 refills | Status: AC | PRN

## 2020-02-20 MED FILL — IBUPROFEN 800 MG TAB: 800 | 7 days supply | Qty: 21 | Fill #0

## 2020-02-20 MED FILL — TAMSULOSIN HCL 0.4 MG CAP: 0.4 | 7 days supply | Qty: 7 | Fill #0

## 2020-02-20 MED FILL — oxyCODONE HCL 5 MG TABS: 5 | 2 days supply | Qty: 8 | Fill #0

## 2020-02-20 MED FILL — ONDANSETRON ODT 4MG TBDP: 4 | 3 days supply | Qty: 10 | Fill #0

## 2020-02-20 NOTE — ED Provider Notes (Signed)
[facility] EMERGENCY DEPARTMENT Provider Note   CSN: [id number] Arrival date & time: 02/20/20  1241     History Chief Complaint  Patient presents with  . Abdominal Pain    Craig Watson is a 59 y.o. male.  Patient with history of ureterolithiasis, previous appendectomy presents the emergency department with acute onset of severe left lower quadrant/pelvis pain with associated vomiting started this morning.  Patient states that he thinks that he has a kidney stone.  No fevers, chest pain or shortness of breath.  No dysuria, hematuria.  No testicular pain/swelling or injury.  Patient states that he had a kidney stone in the past that had "passed" when he presented for evaluation.  He has passed 2 small stones in another episode without seeking medical treatment.  No previous urologic interventions.        Past Medical History:  Diagnosis Date  . Carpal tunnel syndrome   . Contact lens/glasses fitting    wears contacts or glasses  . HOH (hard of hearing)    left ear  . Hypertension   . Snores     Patient Active Problem List   Diagnosis Date Noted  . ANXIETY 06/14/2007  . GERD 06/14/2007    Past Surgical History:  Procedure Laterality Date  . APPENDECTOMY  2000   lap append  . CARPAL TUNNEL RELEASE Left 12/29/2013   Procedure: LEFT CARPAL TUNNEL RELEASE;  Surgeon: Wyn Forster., MD;  Location: [facility];  Service: Orthopedics;  Laterality: Left;  . FINGER ARTHROPLASTY  1998   cut tip lt index finger  . LUMBAR LAMINECTOMY  2000  . WISDOM TOOTH EXTRACTION         No family history on file.  Social History   Tobacco Use  . Smoking status: Never Smoker  . Smokeless tobacco: Former Neurosurgeon    Types: Chew  Substance Use Topics  . Alcohol use: No  . Drug use: No    Home Medications Prior to Admission medications   Medication Sig Start Date End Date Taking? Authorizing Provider  HYDROCHLOROTHIAZIDE PO Take by mouth.   Yes  [provider]  HYDROcodone-acetaminophen (NORCO) 5-325 MG per tablet Take 1 tablet by mouth every 4 (four) hours as needed. 01/13/14   Arthor Captain, PA-C  omega-3 acid ethyl esters (LOVAZA) 1 G capsule Take 1 g by mouth daily.    [provider]  ondansetron (ZOFRAN) 4 MG tablet Take 1 tablet (4 mg total) by mouth every 8 (eight) hours as needed for nausea or vomiting. 01/13/14   Harris, Cammy Copa, PA-C  polyethylene glycol (MIRALAX / GLYCOLAX) packet Take 17 g by mouth daily as needed for mild constipation.    [provider]    Allergies    Patient has no known allergies.  Review of Systems   Review of Systems  Constitutional: Negative for fever.  HENT: Negative for rhinorrhea and sore throat.   Eyes: Negative for redness.  Respiratory: Negative for cough.   Cardiovascular: Negative for chest pain.  Gastrointestinal: Positive for abdominal pain, nausea and vomiting. Negative for diarrhea.  Genitourinary: Negative for dysuria, flank pain, hematuria and testicular pain.  Musculoskeletal: Negative for myalgias.  Skin: Negative for rash.  Neurological: Negative for headaches.    Physical Exam Updated Vital Signs BP (!) 166/92   Pulse 69   Temp 98.2 F (36.8 C) (Oral)   Resp 20   Ht 6' (1.829 m)   Wt 104.3 kg  SpO2 99%   BMI 31.19 kg/m   Physical Exam Vitals and nursing note reviewed.  Constitutional:      General: He is in acute distress.     Appearance: He is well-developed.  HENT:     Head: Normocephalic and atraumatic.  Eyes:     General:        Right eye: No discharge.        Left eye: No discharge.     Conjunctiva/sclera: Conjunctivae normal.  Cardiovascular:     Rate and Rhythm: Normal rate and regular rhythm.     Heart sounds: Normal heart sounds.  Pulmonary:     Effort: Pulmonary effort is normal.     Breath sounds: Normal breath sounds.  Abdominal:     Palpations: Abdomen is soft.     Tenderness: There is no abdominal  tenderness.     Comments: Patient reports pain in left lower quadrant and pelvis area, but not reproduced with palpation.  Genitourinary:    Penis: Normal.      Testes: Normal.        Right: Tenderness not present.        Left: Tenderness not present.  Musculoskeletal:     Cervical back: Normal range of motion and neck supple.  Skin:    General: Skin is warm and dry.  Neurological:     Mental Status: He is alert.     ED Results / Procedures / Treatments   Labs (all labs ordered are listed, but only abnormal results are displayed) Labs Reviewed  URINALYSIS, ROUTINE W REFLEX MICROSCOPIC  CBC WITH DIFFERENTIAL/PLATELET  BASIC METABOLIC PANEL    EKG None  Radiology No results found.  Procedures Procedures (including critical care time)  Medications Ordered in ED Medications  ketorolac (TORADOL) 15 MG/ML injection 15 mg (has no administration in time range)  ondansetron (ZOFRAN) injection 4 mg (has no administration in time range)    ED Course  I have reviewed the triage vital signs and the nursing notes.  Pertinent labs & imaging results that were available during my care of the patient were reviewed by me and considered in my medical decision making (see chart for details).  Patient seen and examined. Work-up initiated. Medications ordered. Symptoms suspicious for left ureteral colic.   Vital signs reviewed and are as follows: BP (!) 166/92   Pulse 69   Temp 98.2 F (36.8 C) (Oral)   Resp 20   Ht 6' (1.829 m)   Wt 104.3 kg   SpO2 99%   BMI 31.19 kg/m   2:24 PM CT reviewed with patient and wife at bedside.  Patient has 4 mm left-sided mid ureteral stone.  Normal kidney function.  UA without signs of infection.  Pain is much improved after IV Toradol and patient is now comfortable.  Will give dose of oral pain medication.  Plan for discharge home with urology follow-up if he continues to do well.  Patient counseled on kidney stone treatment. Urged patient to  strain urine and save any stones. Urged urology follow-up and return to [facility] with any complications. Counseled patient to maintain good fluid intake.   Counseled patient on use of Flomax.   Patient counseled on use of narcotic pain medications. Counseled not to combine these medications with others containing tylenol. Urged not to drink alcohol, drive, or perform any other activities that requires focus while taking these medications. The patient verbalizes understanding and agrees with the plan.  2:41 PM patient continues to  do well.  He is ready for discharge.     MDM Rules/Calculators/A&P                      Patient with symptoms of left-sided ureteral colic.  No concerning features.  Pain controlled.  Ready to go home.   Final Clinical Impression(s) / ED Diagnoses Final diagnoses:  Ureteral colic    Rx / DC Orders ED Discharge Orders         Ordered    oxycodone (OXY-IR) 5 MG capsule  Every 6 hours PRN     02/20/20 1424    ibuprofen (ADVIL) 800 MG tablet  Every 8 hours PRN     02/20/20 1424    tamsulosin (FLOMAX) 0.4 MG CAPS capsule  Daily     02/20/20 1424    ondansetron (ZOFRAN ODT) 4 MG disintegrating tablet  Every 8 hours PRN     02/20/20 1424           Renne Crigler, PA-C 02/20/20 1441    Geoffery Lyons, MD 02/21/20 [phone]

## 2020-02-20 NOTE — Discharge Instructions (Addendum)
Please read and follow all provided instructions.  Your diagnoses today include:  1. Ureteral colic     Tests performed today include:  Urine test that showed blood in your urine and no infection  CT scan which showed a 4 millimeter kidney on the left side  Blood test that showed normal kidney function  Vital signs. See below for your results today.   Medications prescribed:   Ibuprofen (Motrin, Advil) - anti-inflammatory pain medication  Do not exceed 600mg  ibuprofen every 6 hours, take with food  You have been prescribed an anti-inflammatory medication or NSAID. Take with food. Take smallest effective dose for the shortest duration needed for your pain. Stop taking if you experience stomach pain or vomiting.    Oxycodone - narcotic pain medication  DO NOT drive or perform any activities that require you to be awake and alert because this medicine can make you drowsy.    Zofran (ondansetron) - for nausea and vomiting   Flomax (tamsulosin) - relaxes smooth muscle to help kidney stones pass  Take any prescribed medications only as directed.  Home care instructions:  Follow any educational materials contained in this packet.  Please double your fluid intake for the next several days. Strain your urine and save any stones that may pass.   BE VERY CAREFUL not to take multiple medicines containing Tylenol (also called acetaminophen). Doing so can lead to an overdose which can damage your liver and cause liver failure and possibly death.   Follow-up instructions: Please follow-up with your urologist or the urologist referral (provided on front page) in the next 1 week for further evaluation of your symptoms.  If you need to return to the Emergency Department, go to [facility] and not [facility]. The urologists are located at [facility] and can better care for you at this location.  Return instructions:  If you need to return to the Emergency Department,  go to [facility] and not [facility]. The urologists are located at [facility] and can better care for you at this location.   Please return to the Emergency Department if you experience worsening symptoms.  Please return if you develop fever or uncontrolled pain or vomiting.  Please return if you have any other emergent concerns.  Additional Information:  Your vital signs today were: BP (!) 166/92   Pulse 69   Temp 98.2 F (36.8 C) (Oral)   Resp 20   Ht 6' (1.829 m)   Wt 104.3 kg   SpO2 99%   BMI 31.19 kg/m  If your blood pressure (BP) was elevated above 135/85 this visit, please have this repeated by your doctor within one month. --------------

## 2020-02-20 NOTE — ED Triage Notes (Signed)
Lower left quadrant pain since last night.

## 2021-06-15 IMAGING — CT CT RENAL STONE PROTOCOL
2 of 4 series · 16 of 46 positions shown, 18 images · non-contrast
Comparison: 01/13/2014

CLINICAL DATA: Left lower quadrant and flank pain since last night,
history of nephrolithiasis

EXAM:
CT ABDOMEN AND PELVIS WITHOUT CONTRAST
TECHNIQUE: Multidetector CT imaging of the abdomen and pelvis was performed
following the standard protocol without IV contrast.

[Series 2: axial st · axial · 0.97mm/px · z∈[-556,-61]mm · 13 of 109 slices shown, 15 images]
[im 5/109  soft-tissue]
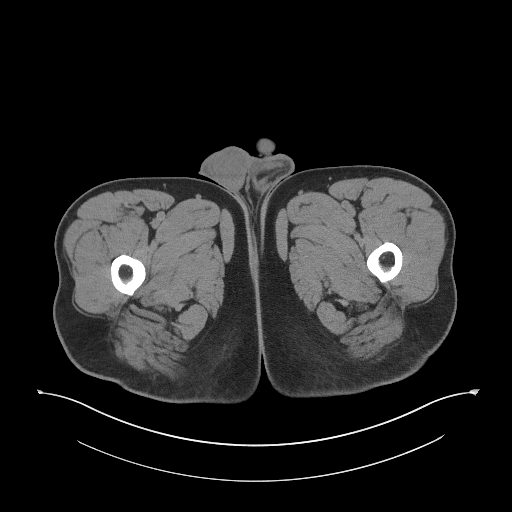
[im 5/109  bone]
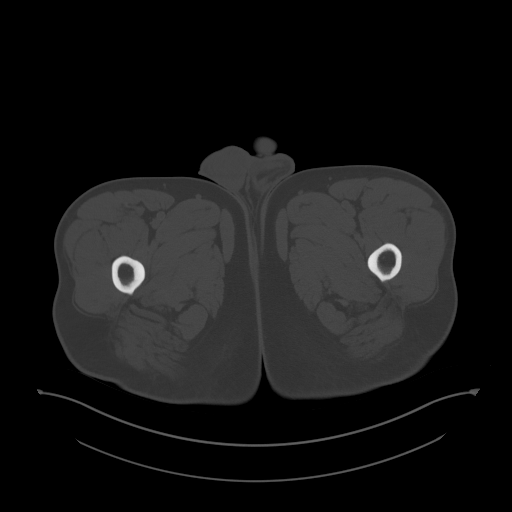
[im 14/109  soft-tissue]
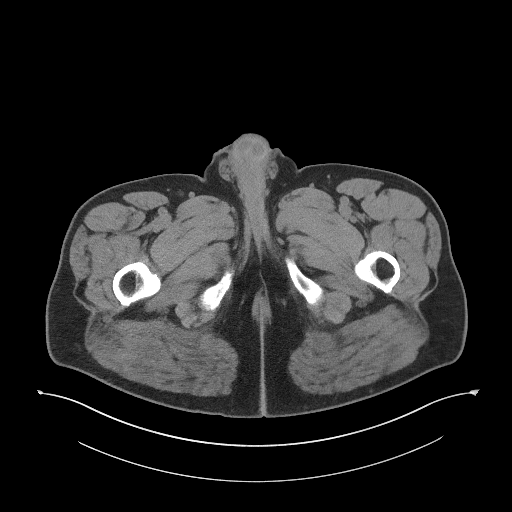
[im 23/109  soft-tissue]
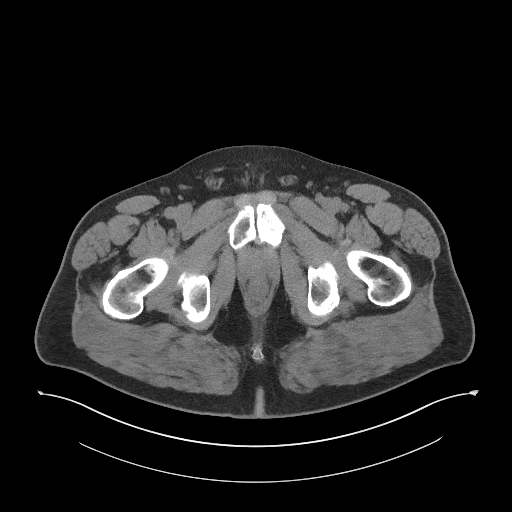
[im 32/109  soft-tissue]
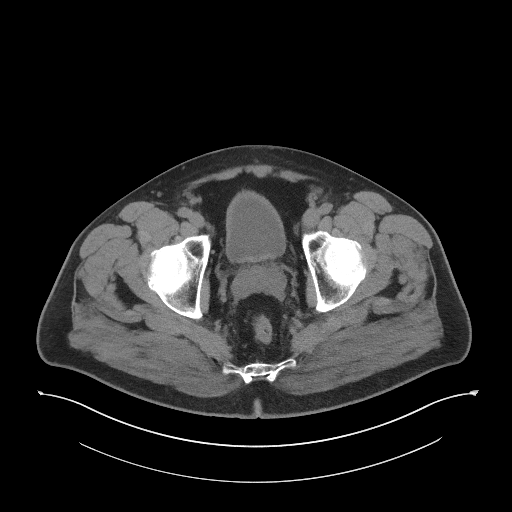
[im 37/109  soft-tissue]
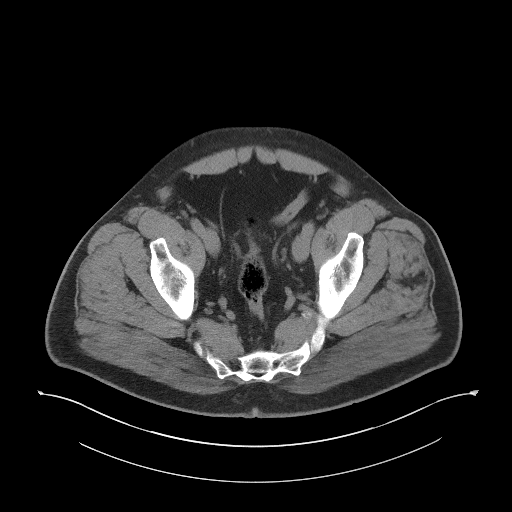
[im 46/109  soft-tissue]
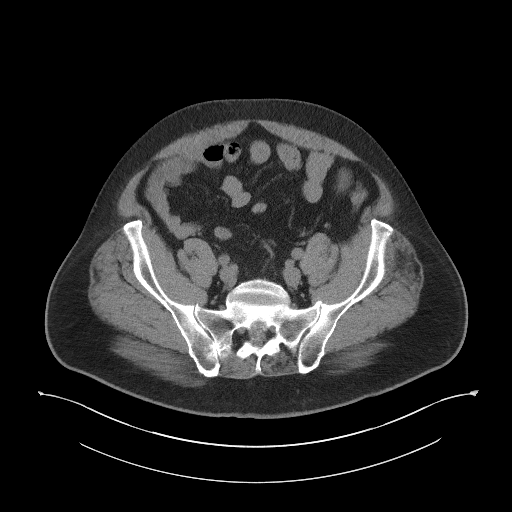
[im 55/109  soft-tissue]
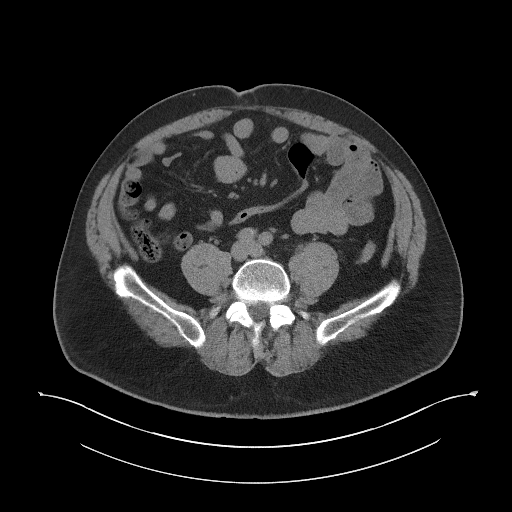
[im 64/109  soft-tissue]
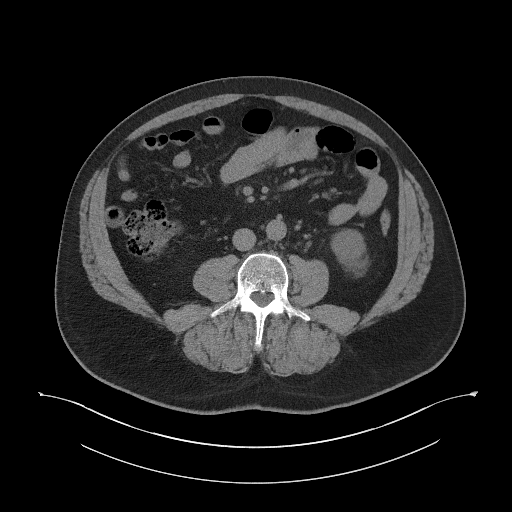
[im 73/109  soft-tissue]
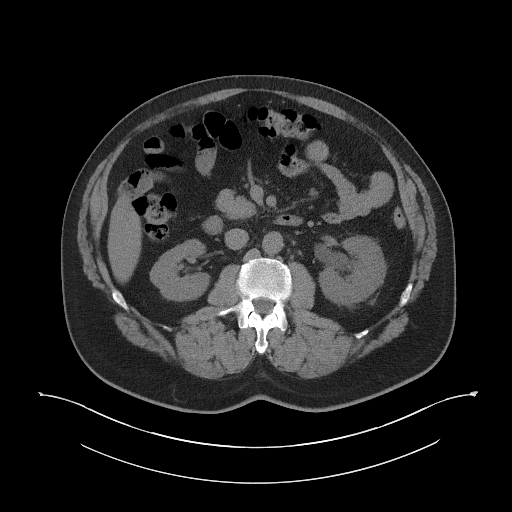
[im 73/109  bone]
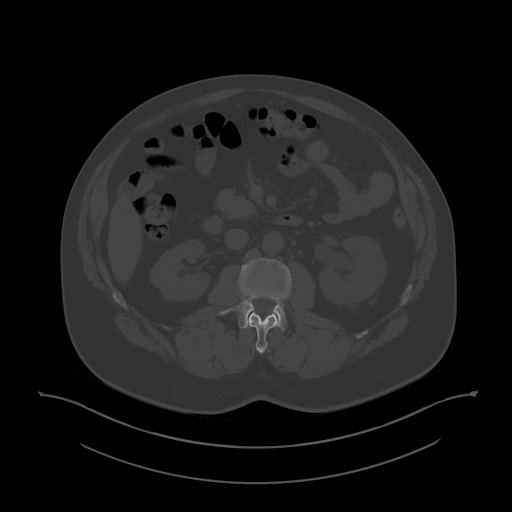
[im 77/109  soft-tissue]
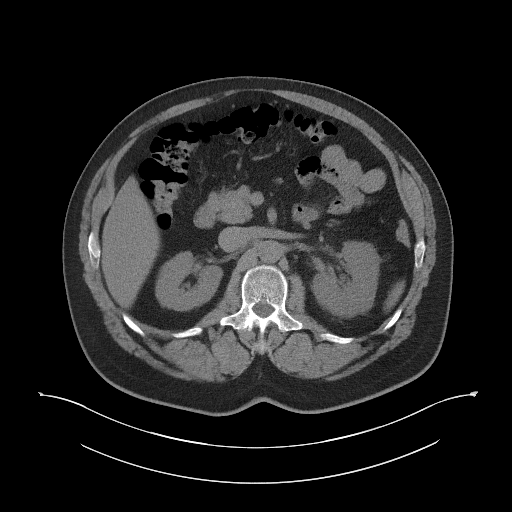
[im 86/109  soft-tissue]
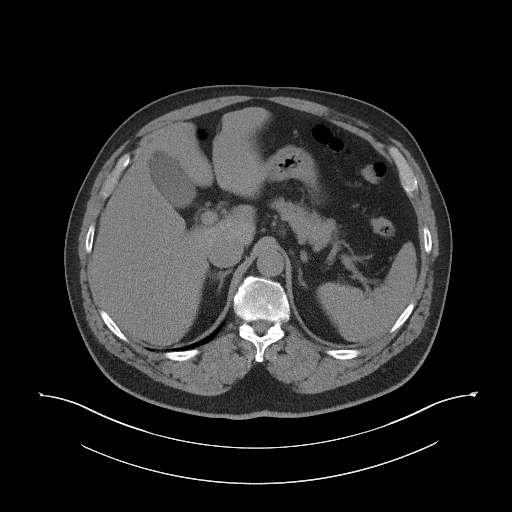
[im 95/109  soft-tissue]
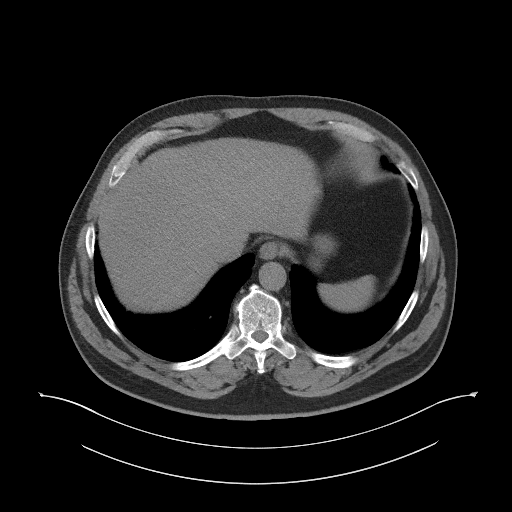
[im 104/109  soft-tissue]
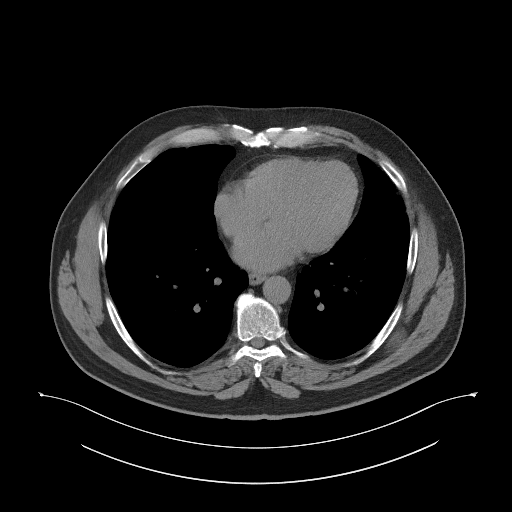

[Series 5: coronal st · coronal · 0.84mm/px · 3 of 120 slices shown]
[im 40/120  soft-tissue]
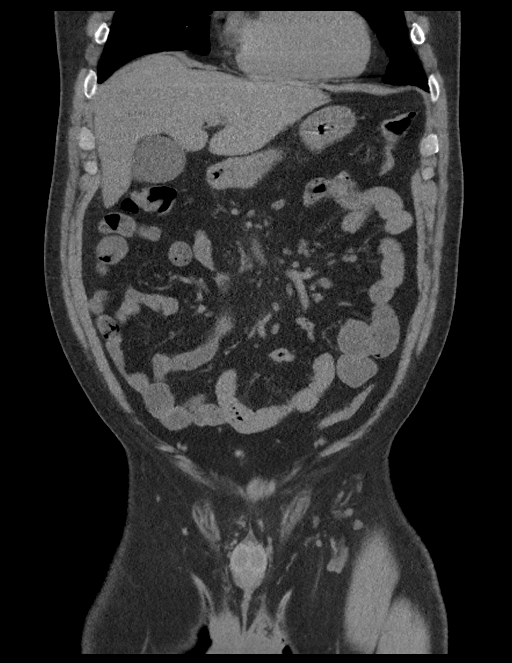
[im 53/120  soft-tissue]
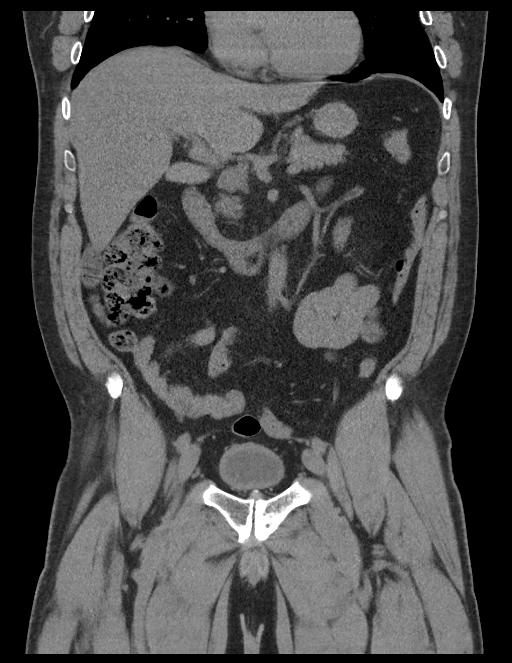
[im 67/120  soft-tissue]
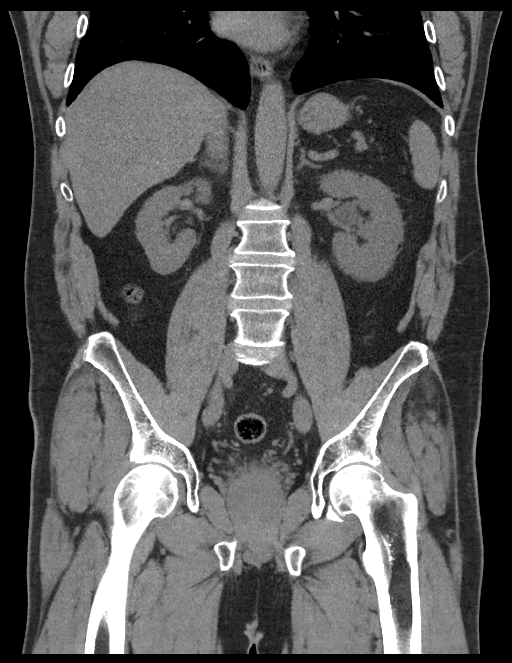

[16 of 46 positions shown; findings below may reference images not displayed]

FINDINGS: Lower chest: No acute abnormality.

Hepatobiliary: No focal liver abnormality is seen. No gallstones,
gallbladder wall thickening, or biliary dilatation.

Pancreas: Unremarkable. No pancreatic ductal dilatation or
surrounding inflammatory changes.

Spleen: Normal in size without focal abnormality.

Adrenals/Urinary Tract: Adrenal glands unremarkable. Right kidney
unremarkable. Mild inflammatory/edematous changes around the left
kidney and renal collecting system. 0.4 cm calculus in the upper
pole renal collecting system. There is mild hydronephrosis and
ureterectasis to the level of a 0.4 cm proximal ureteral calculus.
Urinary bladder is nondistended.

Stomach/Bowel: Stomach and small bowel are decompressed. Previous
appendectomy. Colon is nondilated, unremarkable.

Vascular/Lymphatic: Mild atheromatous calcifications just distal to
the aortic bifurcation, without aneurysm. No abdominal or pelvic
adenopathy.

Reproductive: Mild prostate enlargement.

Other: Bilateral pelvic phleboliths.  No ascites.  No free air.

Musculoskeletal: Mild narrowing of the L4-5 disc. No fracture or
worrisome bone lesion.
IMPRESSION: 1. 0.4 cm obstructing mid left ureteral calculus.
2. Left nephrolithiasis
